# Patient Record
Sex: Female | Born: 1950 | Race: Black or African American | Hispanic: No | Smoking: Current some day smoker
Health system: Southern US, Community
[De-identification: ages and names within clinical notes are randomized; demographics above are authoritative.]

## PROBLEM LIST (undated history)

## (undated) DIAGNOSIS — D649 Anemia, unspecified: Secondary | ICD-10-CM

## (undated) DIAGNOSIS — E119 Type 2 diabetes mellitus without complications: Secondary | ICD-10-CM

## (undated) DIAGNOSIS — E7800 Pure hypercholesterolemia, unspecified: Secondary | ICD-10-CM

## (undated) DIAGNOSIS — I10 Essential (primary) hypertension: Secondary | ICD-10-CM

## (undated) HISTORY — PX: BLADDER SURGERY: SHX569

## (undated) HISTORY — DX: Type 2 diabetes mellitus without complications: E11.9

## (undated) HISTORY — PX: BACK SURGERY: SHX140

## (undated) HISTORY — DX: Anemia, unspecified: D64.9

## (undated) HISTORY — DX: Pure hypercholesterolemia, unspecified: E78.00

## (undated) HISTORY — PX: NECK SURGERY: SHX720

## (undated) HISTORY — DX: Essential (primary) hypertension: I10

---

## 2001-12-31 ENCOUNTER — Encounter: Payer: Self-pay | Admitting: Neurosurgery

## 2001-12-31 ENCOUNTER — Encounter: Admission: RE | Admit: 2001-12-31 | Discharge: 2001-12-31 | Payer: Self-pay | Admitting: Neurosurgery

## 2002-01-22 ENCOUNTER — Encounter: Payer: Self-pay | Admitting: Neurosurgery

## 2002-01-25 ENCOUNTER — Encounter: Payer: Self-pay | Admitting: Neurosurgery

## 2002-01-25 ENCOUNTER — Ambulatory Visit (HOSPITAL_COMMUNITY): Admission: RE | Admit: 2002-01-25 | Discharge: 2002-01-26 | Payer: Self-pay | Admitting: Neurosurgery

## 2004-12-08 ENCOUNTER — Ambulatory Visit: Payer: Self-pay

## 2006-01-03 ENCOUNTER — Ambulatory Visit: Payer: Self-pay

## 2006-09-29 ENCOUNTER — Other Ambulatory Visit: Payer: Self-pay

## 2006-09-29 ENCOUNTER — Observation Stay: Payer: Self-pay | Admitting: Internal Medicine

## 2006-12-12 HISTORY — PX: VAGINAL HYSTERECTOMY: SHX2639

## 2007-01-08 ENCOUNTER — Ambulatory Visit: Payer: Self-pay

## 2007-02-06 ENCOUNTER — Ambulatory Visit: Payer: Self-pay | Admitting: Obstetrics & Gynecology

## 2007-02-13 ENCOUNTER — Inpatient Hospital Stay: Payer: Self-pay | Admitting: Obstetrics & Gynecology

## 2007-10-12 ENCOUNTER — Ambulatory Visit: Payer: Self-pay | Admitting: Internal Medicine

## 2008-01-16 ENCOUNTER — Ambulatory Visit: Payer: Self-pay | Admitting: Gastroenterology

## 2008-01-31 ENCOUNTER — Ambulatory Visit: Payer: Self-pay

## 2009-02-03 ENCOUNTER — Ambulatory Visit: Payer: Self-pay

## 2010-02-04 ENCOUNTER — Ambulatory Visit: Payer: Self-pay

## 2011-02-24 ENCOUNTER — Ambulatory Visit: Payer: Self-pay

## 2011-06-20 ENCOUNTER — Emergency Department: Payer: Self-pay | Admitting: Emergency Medicine

## 2011-06-22 ENCOUNTER — Emergency Department: Payer: Self-pay | Admitting: Emergency Medicine

## 2012-04-05 ENCOUNTER — Ambulatory Visit: Payer: Self-pay

## 2013-04-09 ENCOUNTER — Ambulatory Visit: Payer: Self-pay

## 2013-11-18 ENCOUNTER — Encounter: Payer: Self-pay | Admitting: Podiatry

## 2013-11-18 ENCOUNTER — Ambulatory Visit (INDEPENDENT_AMBULATORY_CARE_PROVIDER_SITE_OTHER): Payer: BC Managed Care – PPO

## 2013-11-18 ENCOUNTER — Ambulatory Visit (INDEPENDENT_AMBULATORY_CARE_PROVIDER_SITE_OTHER): Payer: BC Managed Care – PPO | Admitting: Podiatry

## 2013-11-18 VITALS — BP 145/86 | HR 77 | Resp 16 | Ht 64.0 in | Wt 165.0 lb

## 2013-11-18 DIAGNOSIS — M79609 Pain in unspecified limb: Secondary | ICD-10-CM

## 2013-11-18 DIAGNOSIS — M898X7 Other specified disorders of bone, ankle and foot: Secondary | ICD-10-CM

## 2013-11-18 DIAGNOSIS — M79671 Pain in right foot: Secondary | ICD-10-CM

## 2013-11-18 DIAGNOSIS — M898X9 Other specified disorders of bone, unspecified site: Secondary | ICD-10-CM

## 2013-11-18 NOTE — Progress Notes (Signed)
Chelsea Williams presents today as a 63 year old white female a chief complaint of pain to the hallux nail plate right foot she states is been this way for about 6 weeks slowly getting worse she says it sometimes better than it has been but he the bedsheets bother she's been soaking in Epsom salts and she tried trimming a portion of the nail plate off which resulted in a fracturing of the nail plate she states it has felt some better since that point.  Objective: Vital signs are stable she is alert and oriented x3. I have reviewed her past medical history medications allergies surgeries social history family history and review of systems. Objective evaluation also demonstrates strong palpable pulses bilateral capillary fill time to digits one through 5 is noted to be immediate. Normal neurologic sensorium per Semmes-Weinstein monofilament bilateral. Deep tendon reflexes are equal bilateral. Muscle strength is normal bilateral. Orthopedic evaluation demonstrates all joints distal to the ankle have a full range of motion without crepitus. She does have tenderness on dorsal plantar palpation of the hallux nail plate right greater than left. Radiographic evaluation does demonstrate a subungual exostosis hallux right.  Assessment: Subungual exostosis hallux right.  Plan: Discussed appropriate shoe gear stretching sizes ice therapy debridement options surgical options. I will followup with her as needed.

## 2013-11-18 NOTE — Progress Notes (Signed)
N  PAIN L GREAT TOENAIL RIGHT FOOT D 6 WEEKS O SLOWLY C BETTER  A BED SHEETS T SOAKS IN EPSON SALT

## 2014-05-16 ENCOUNTER — Ambulatory Visit: Payer: Self-pay

## 2015-10-12 ENCOUNTER — Other Ambulatory Visit: Payer: Self-pay | Admitting: Unknown Physician Specialty

## 2015-10-12 ENCOUNTER — Other Ambulatory Visit: Payer: Self-pay | Admitting: *Deleted

## 2015-10-12 ENCOUNTER — Inpatient Hospital Stay
Admission: RE | Admit: 2015-10-12 | Discharge: 2015-10-12 | Disposition: A | Discharge status: Home / Self Care | Payer: Self-pay | Source: Ambulatory Visit | Attending: *Deleted | Admitting: *Deleted

## 2015-10-12 DIAGNOSIS — R928 Other abnormal and inconclusive findings on diagnostic imaging of breast: Secondary | ICD-10-CM

## 2015-10-12 DIAGNOSIS — Z9289 Personal history of other medical treatment: Secondary | ICD-10-CM

## 2015-11-04 ENCOUNTER — Other Ambulatory Visit: Payer: Self-pay | Admitting: Unknown Physician Specialty

## 2015-11-04 ENCOUNTER — Ambulatory Visit
Admission: RE | Admit: 2015-11-04 | Discharge: 2015-11-04 | Disposition: A | Discharge status: Home / Self Care | Payer: BC Managed Care – PPO | Source: Ambulatory Visit | Attending: Unknown Physician Specialty | Admitting: Unknown Physician Specialty

## 2015-11-04 DIAGNOSIS — R928 Other abnormal and inconclusive findings on diagnostic imaging of breast: Secondary | ICD-10-CM | POA: Insufficient documentation

## 2016-10-27 ENCOUNTER — Other Ambulatory Visit: Payer: Self-pay | Admitting: Obstetrics & Gynecology

## 2016-10-27 DIAGNOSIS — Z1231 Encounter for screening mammogram for malignant neoplasm of breast: Secondary | ICD-10-CM

## 2016-10-28 LAB — HM PAP SMEAR: HM PAP: NEGATIVE

## 2016-11-29 IMAGING — MG MM DIAG BREAST TOMO UNI RIGHT
3 series · 4 of 7 positions shown · non-contrast
Comparison: Previous exam(s).

CLINICAL DATA: Patient presents for additional views of the right
breast as followup to a recent screening exam suggesting a possible
mass over the right periareolar region.

EXAM:
DIGITAL DIAGNOSTIC right MAMMOGRAM WITH CAD
ULTRASOUND right BREAST

[R MLO synth-2D]
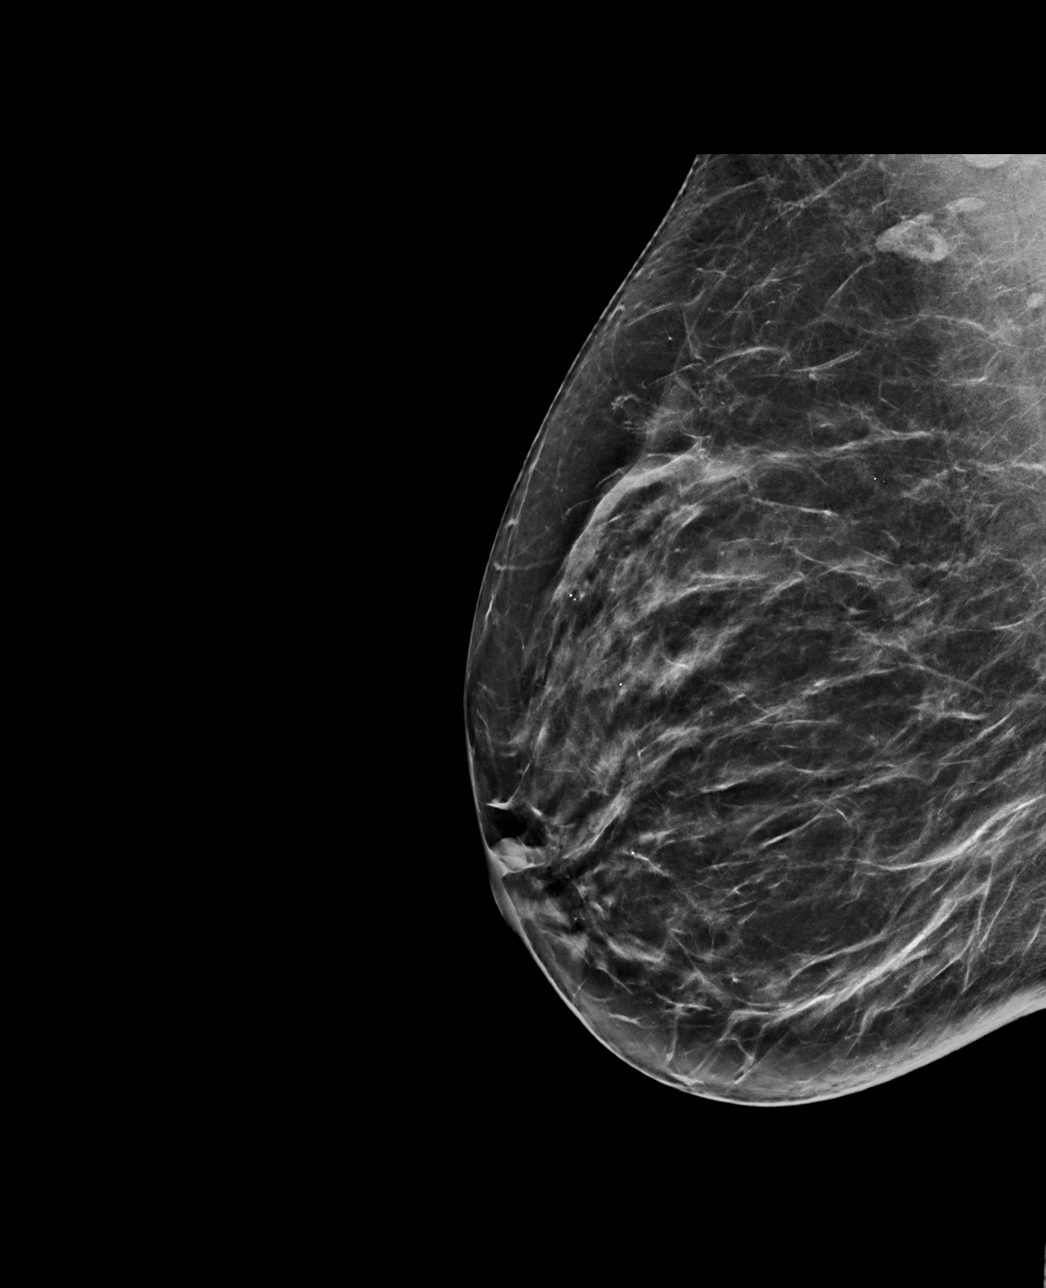

[R MLO]
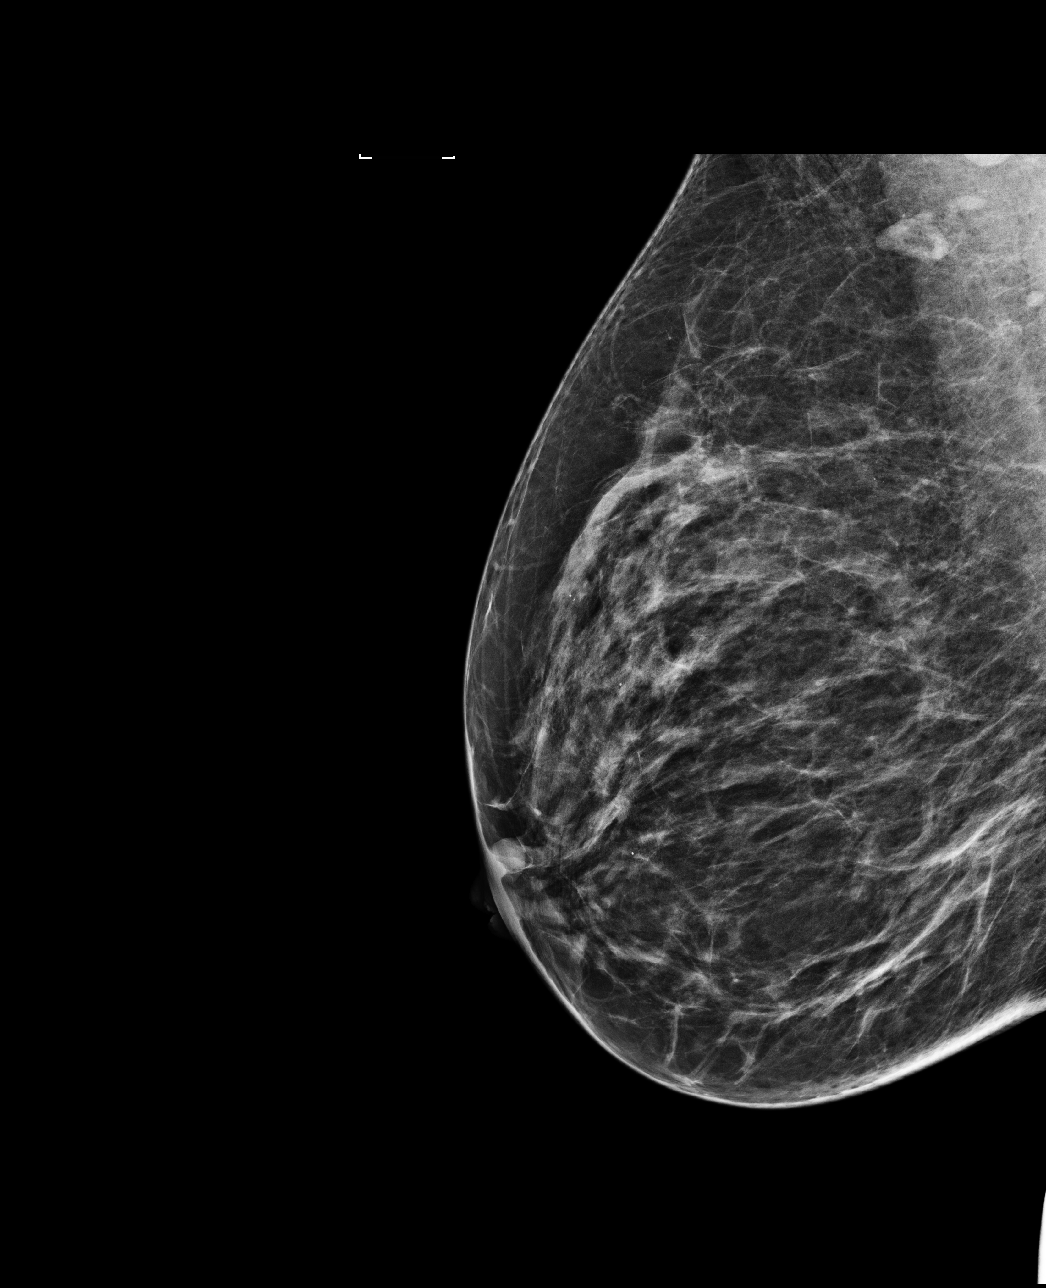

[R CC tomo · 2 of 71 frames shown]
[frame 23/71]
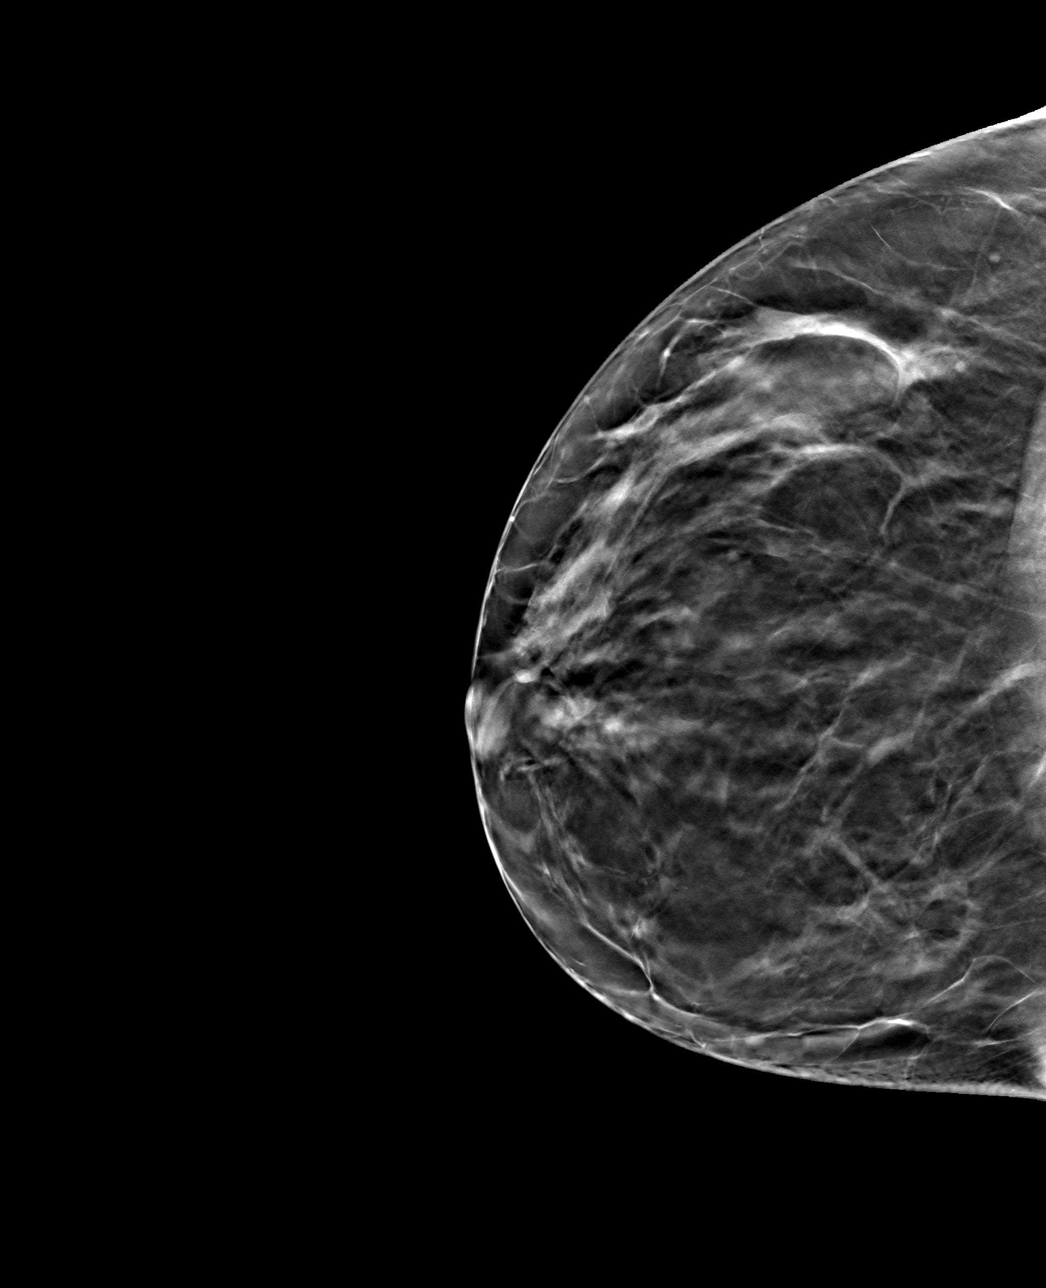
[frame 36/71]
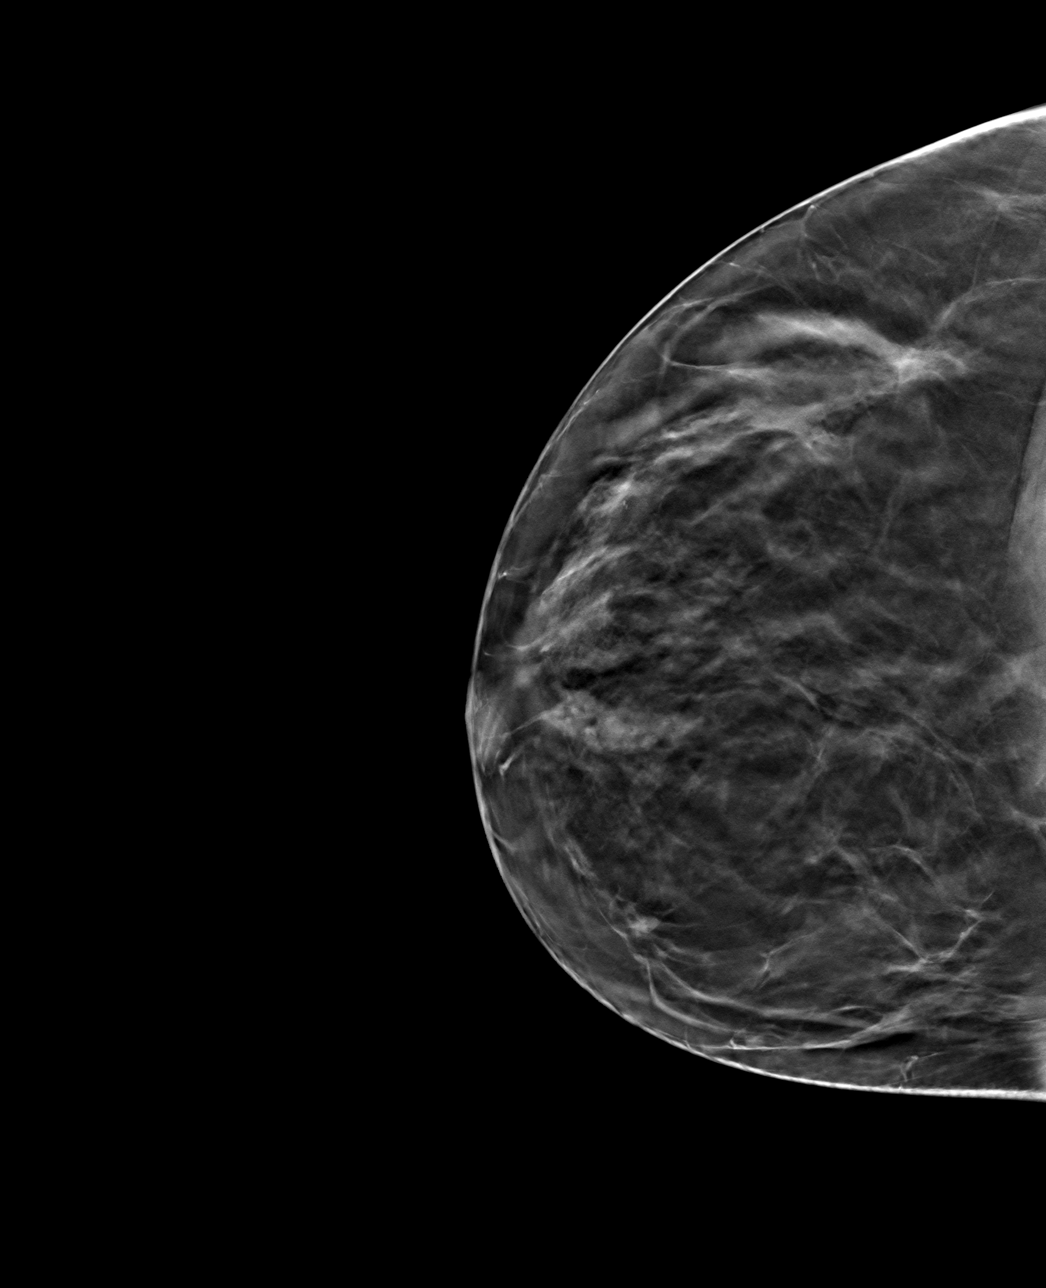

[4 of 7 positions shown; findings below may reference images not displayed]

ACR Breast Density Category b: There are scattered areas of
fibroglandular density.
FINDINGS: CC and MLO tomographic images demonstrate no definite focal
abnormality over the upper central right periareolar region.

Mammographic images were processed with CAD.

Targeted ultrasound is performed, showing no focal abnormality over
the right periareolar/retroareolar region.
IMPRESSION: No focal abnormality over the right periareolar/retroareolar region.

RECOMMENDATION:
Recommend continued annual bilateral screening mammographic
followup.

I have discussed the findings and recommendations with the patient.
Results were also provided in writing at the conclusion of the
visit. If applicable, a reminder letter will be sent to the patient
regarding the next appointment.

BI-RADS CATEGORY  Surg category 1

## 2016-12-08 ENCOUNTER — Ambulatory Visit
Admission: RE | Admit: 2016-12-08 | Discharge: 2016-12-08 | Disposition: A | Discharge status: Home / Self Care | Payer: BC Managed Care – PPO | Source: Ambulatory Visit | Attending: Obstetrics & Gynecology | Admitting: Obstetrics & Gynecology

## 2016-12-08 DIAGNOSIS — Z1231 Encounter for screening mammogram for malignant neoplasm of breast: Secondary | ICD-10-CM | POA: Diagnosis not present

## 2016-12-08 LAB — HM MAMMOGRAPHY

## 2017-10-26 ENCOUNTER — Ambulatory Visit: Payer: BC Managed Care – PPO | Admitting: Obstetrics & Gynecology

## 2017-10-27 ENCOUNTER — Encounter: Payer: Self-pay | Admitting: Obstetrics & Gynecology

## 2017-10-27 ENCOUNTER — Ambulatory Visit (INDEPENDENT_AMBULATORY_CARE_PROVIDER_SITE_OTHER): Payer: BC Managed Care – PPO | Admitting: Obstetrics & Gynecology

## 2017-10-27 VITALS — BP 120/80 | HR 72 | Ht 64.0 in | Wt 164.0 lb

## 2017-10-27 DIAGNOSIS — Z1239 Encounter for other screening for malignant neoplasm of breast: Secondary | ICD-10-CM

## 2017-10-27 DIAGNOSIS — Z1211 Encounter for screening for malignant neoplasm of colon: Secondary | ICD-10-CM

## 2017-10-27 DIAGNOSIS — Z01419 Encounter for gynecological examination (general) (routine) without abnormal findings: Secondary | ICD-10-CM

## 2017-10-27 DIAGNOSIS — Z1231 Encounter for screening mammogram for malignant neoplasm of breast: Secondary | ICD-10-CM

## 2017-10-27 DIAGNOSIS — Z0000 Encounter for general adult medical examination without abnormal findings: Secondary | ICD-10-CM | POA: Insufficient documentation

## 2017-10-27 NOTE — Patient Instructions (Signed)
PAP every three years Mammogram every year    Call [phone] to schedule at [facility] Colonoscopy every 10 years Labs yearly (with PCP) 

## 2017-10-27 NOTE — Progress Notes (Signed)
HPI:      [name] is a 66 y.o. [id number] who LMP was in the past, she presents today for her annual examination.  The patient has no complaints today. The patient is not sexually active. Herlast pap: approximate date 2017 and was normal and last mammogram: approximate date 2017 and was normal.  The patient does perform self breast exams.  There is no notable family history of breast or ovarian cancer in her family. The patient is not taking hormone replacement therapy. Patient denies post-menopausal vaginal bleeding.   The patient has regular exercise: yes. The patient denies current symptoms of depression.    GYN Hx: Last Colonoscopy:8 years ago. Normal.  Last DEXA: never ago.    PMHx: Past Medical History:  Diagnosis Date  . Anemia   . Diabetes (HCC)   . HBP (high blood pressure)   . High cholesterol    Past Surgical History:  Procedure Laterality Date  . BACK SURGERY    . BLADDER SURGERY    . NECK SURGERY     Family History  Problem Relation Age of Onset  . Diabetes Mother   . Diabetes Sister   . Heart failure Paternal Aunt   . Diabetes Maternal Grandmother   . Heart failure Maternal Grandmother   . Breast cancer Neg Hx    Social History   Tobacco Use  . Smoking status: Current Some Day Smoker  . Smokeless tobacco: Never Used  Substance Use Topics  . Alcohol use: Yes    Comment: OCCASIONALLY  . Drug use: No    Current Outpatient Medications:  .  aspirin 325 MG tablet, Take 325 mg by mouth daily., Disp: , Rfl:  .  BIOTIN PO, Take by mouth as needed., Disp: , Rfl:  .  Cholecalciferol (VITAMIN D PO), Take by mouth as needed., Disp: , Rfl:  .  Cyanocobalamin (VITAMIN B 12 PO), Take by mouth as needed., Disp: , Rfl:  .  ezetimibe (ZETIA) 10 MG tablet, Take 10 mg by mouth., Disp: , Rfl:  .  ibuprofen (ADVIL,MOTRIN) 200 MG tablet, Take 200 mg by mouth daily., Disp: , Rfl:  .  metFORMIN (GLUCOPHAGE) 1000 MG tablet, , Disp: , Rfl:  .  Multiple Vitamins-Minerals  (MULTIVITAMIN PO), Take by mouth daily., Disp: , Rfl:  .  ONE TOUCH ULTRA TEST test strip, , Disp: , Rfl:  .  Red Yeast Rice 600 MG TABS, Take by mouth., Disp: , Rfl:  .  SUPREP BOWEL PREP SOLN, , Disp: , Rfl:  .  FOLIC ACID PO, Take by mouth as needed., Disp: , Rfl:  .  valsartan-hydrochlorothiazide (DIOVAN-HCT) 160-12.5 MG per tablet, , Disp: , Rfl:  .  VITAMIN E PO, Take by mouth as needed., Disp: , Rfl:  .  ZETIA 10 MG tablet, , Disp: , Rfl:  Allergies: Lactuca virosa  Review of Systems  Constitutional: Negative for chills, fever and malaise/fatigue.  HENT: Negative for congestion, sinus pain and sore throat.   Eyes: Negative for blurred vision and pain.  Respiratory: Negative for cough and wheezing.   Cardiovascular: Negative for chest pain and leg swelling.  Gastrointestinal: Negative for abdominal pain, constipation, diarrhea, heartburn, nausea and vomiting.  Genitourinary: Negative for dysuria, frequency, hematuria and urgency.  Musculoskeletal: Negative for back pain, joint pain, myalgias and neck pain.  Skin: Negative for itching and rash.  Neurological: Negative for dizziness, tremors and weakness.  Endo/Heme/Allergies: Does not bruise/bleed easily.  Psychiatric/Behavioral: Negative for depression. The patient  is not nervous/anxious and does not have insomnia.     Objective: BP 120/80   Pulse 72   Ht 5\' 4"  (1.626 m)   Wt 164 lb (74.4 kg)   BMI 28.15 kg/m   Filed Weights   10/27/17 0931  Weight: 164 lb (74.4 kg)   Body mass index is 28.15 kg/m. Physical Exam  Constitutional: She is oriented to person, place, and time. She appears well-developed and well-nourished. No distress.  Genitourinary: Rectum normal, vagina normal and uterus normal. Pelvic exam was performed with patient supine. There is no rash or lesion on the right labia. There is no rash or lesion on the left labia. Vagina exhibits no lesion. No bleeding in the vagina. Right adnexum does not display mass  and does not display tenderness. Left adnexum does not display mass and does not display tenderness. Cervix does not exhibit motion tenderness, lesion, friability or polyp.   Uterus is mobile and midaxial. Uterus is not enlarged or exhibiting a mass.  HENT:  Head: Normocephalic and atraumatic. Head is without laceration.  Right Ear: Hearing normal.  Left Ear: Hearing normal.  Nose: No epistaxis.  No foreign bodies.  Mouth/Throat: Uvula is midline, oropharynx is clear and moist and mucous membranes are normal.  Eyes: Pupils are equal, round, and reactive to light.  Neck: Normal range of motion. Neck supple. No thyromegaly present.  Cardiovascular: Normal rate and regular rhythm. Exam reveals no gallop and no friction rub.  No murmur heard. Pulmonary/Chest: Effort normal and breath sounds normal. No respiratory distress. She has no wheezes. Right breast exhibits no mass, no skin change and no tenderness. Left breast exhibits no mass, no skin change and no tenderness.  Abdominal: Soft. Bowel sounds are normal. She exhibits no distension. There is no tenderness. There is no rebound.  Musculoskeletal: Normal range of motion.  Neurological: She is alert and oriented to person, place, and time. No cranial nerve deficit.  Skin: Skin is warm and dry.  Psychiatric: She has a normal mood and affect. Judgment normal.  Vitals reviewed.  Assessment: Annual Exam 1. Annual physical exam   2. Screening for breast cancer    Plan:            1.  Cervical Screening-  Pap smear schedule reviewed with patient  2. Breast screening- Exam annually and mammogram scheduled  3. Colonoscopy every 10 years, Hemoccult testing after age 64  4. Labs managed by PCP  5. Counseling for hormonal therapy: none, no change in therapy today    F/U  Return in about 1 year (around 10/27/2018) for Annual.  Annamarie MajorPaul Hartman Minahan, MD, Merlinda FrederickFACOG [facility], [facility] 10/27/2017  10:12 AM

## 2017-11-04 LAB — FECAL OCCULT BLOOD, IMMUNOCHEMICAL: Fecal Occult Bld: NEGATIVE

## 2017-12-13 ENCOUNTER — Ambulatory Visit
Admission: RE | Admit: 2017-12-13 | Discharge: 2017-12-13 | Disposition: A | Discharge status: Home / Self Care | Payer: BC Managed Care – PPO | Source: Ambulatory Visit | Attending: Obstetrics & Gynecology | Admitting: Obstetrics & Gynecology

## 2017-12-13 DIAGNOSIS — Z1231 Encounter for screening mammogram for malignant neoplasm of breast: Secondary | ICD-10-CM | POA: Diagnosis not present

## 2017-12-13 DIAGNOSIS — Z1239 Encounter for other screening for malignant neoplasm of breast: Secondary | ICD-10-CM

## 2018-01-03 IMAGING — MG MM DIGITAL SCREENING BILAT W/ CAD
4 series · 4 of 4 positions shown · non-contrast
Comparison: Previous exam(s).

CLINICAL DATA: Screening.

EXAM:
DIGITAL SCREENING BILATERAL MAMMOGRAM WITH CAD

[R CC]
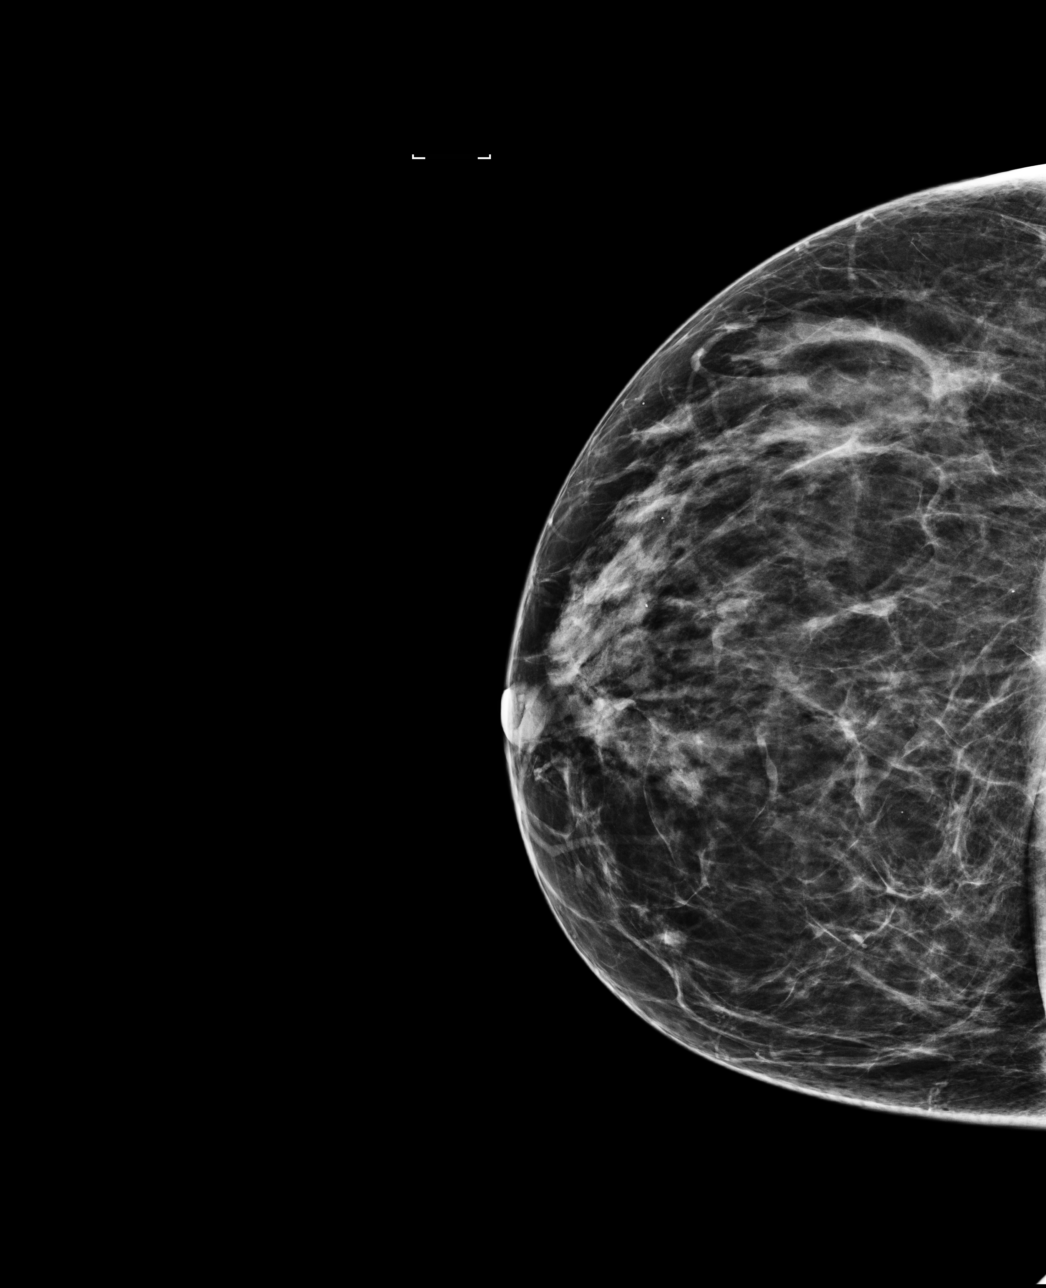

[R MLO]
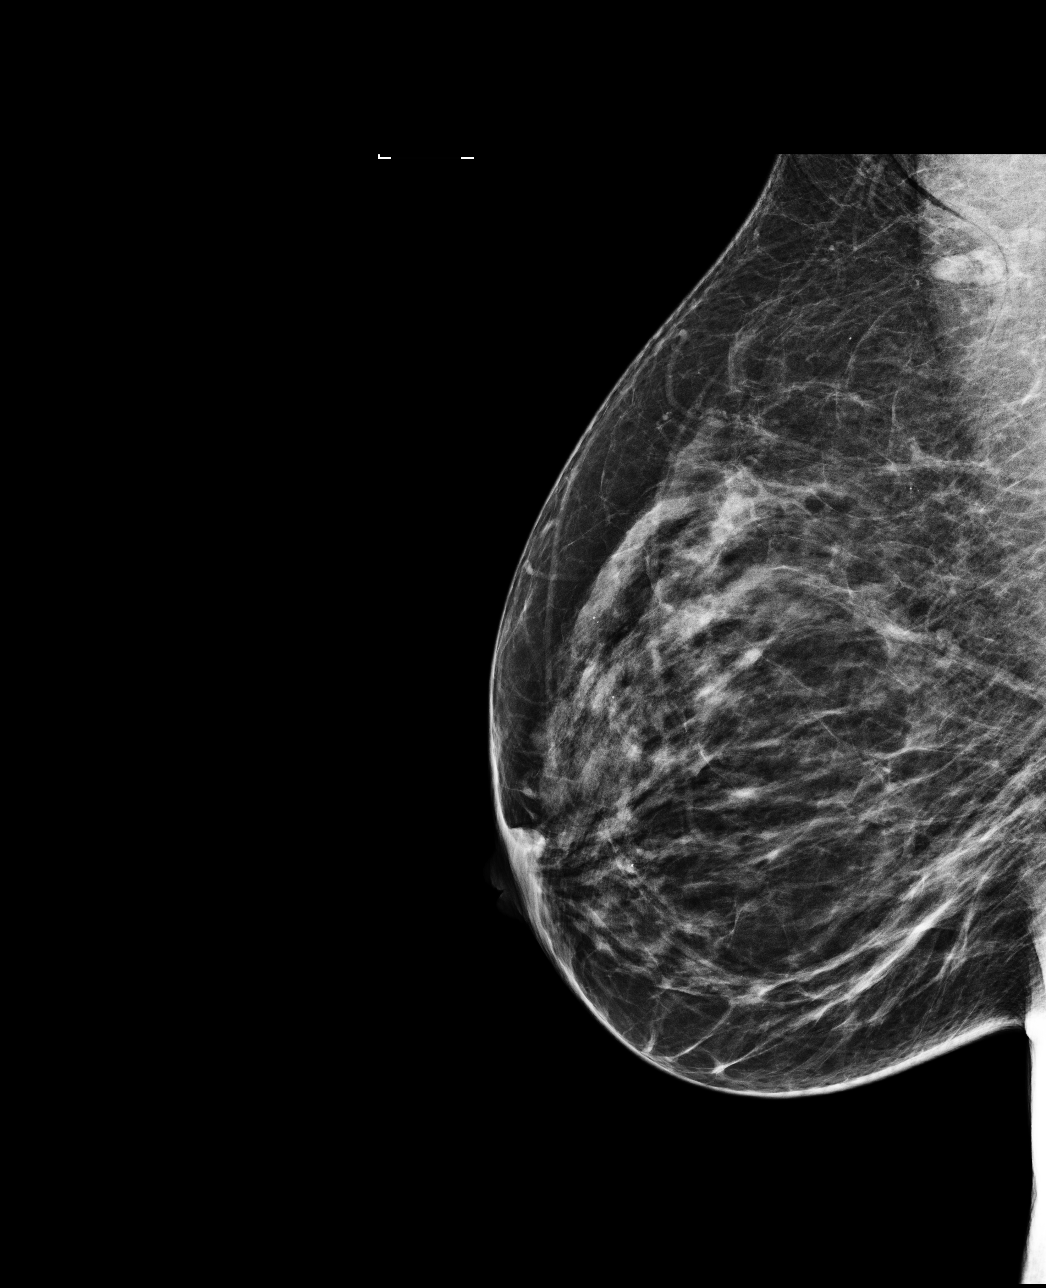

[L MLO]
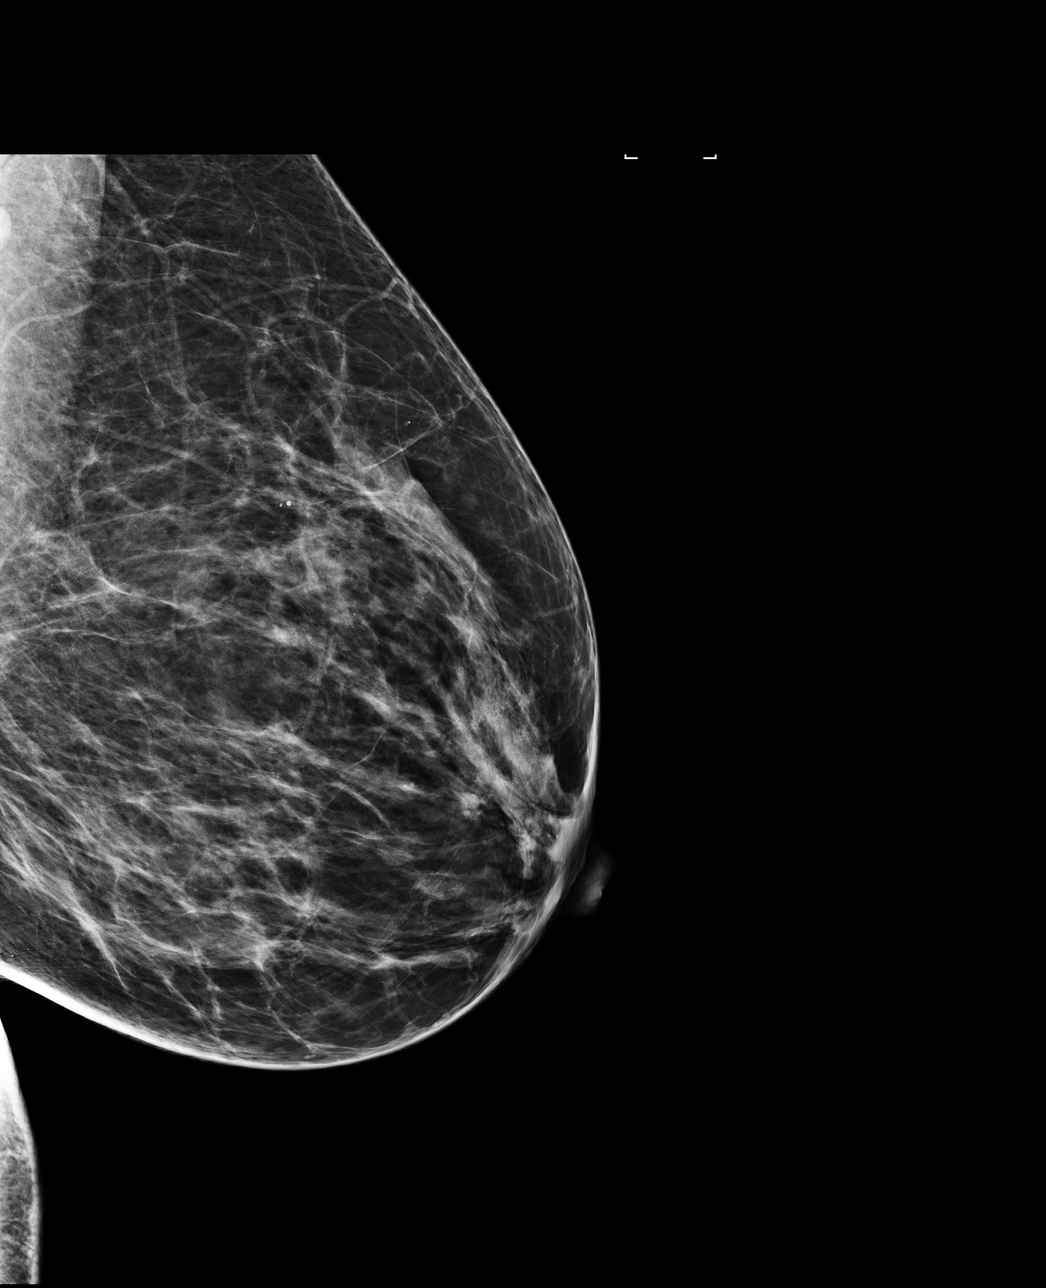

[L CC]
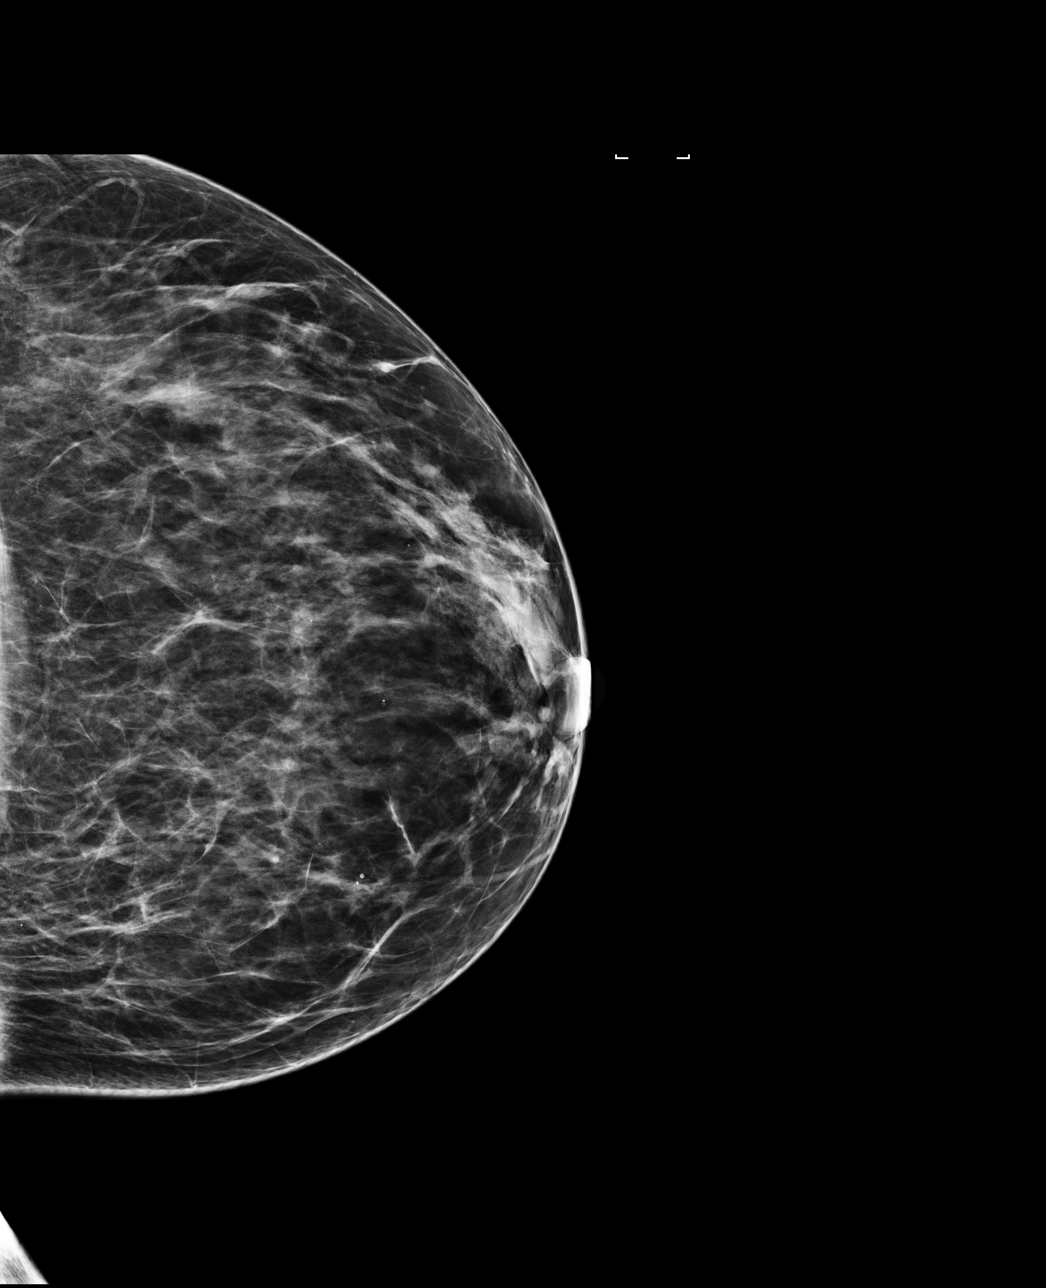

[4 of 4 positions shown; findings below may reference images not displayed]

ACR Breast Density Category b: There are scattered areas of
fibroglandular density.
FINDINGS: There are no findings suspicious for malignancy. Images were
processed with CAD.
IMPRESSION: No mammographic evidence of malignancy. A result letter of this
screening mammogram will be mailed directly to the patient.

RECOMMENDATION:
Screening mammogram in one year. (Code:AS-G-LCT)

BI-RADS CATEGORY  1: Negative.

## 2018-11-21 ENCOUNTER — Other Ambulatory Visit: Payer: Self-pay | Admitting: Internal Medicine

## 2018-11-21 DIAGNOSIS — Z1231 Encounter for screening mammogram for malignant neoplasm of breast: Secondary | ICD-10-CM

## 2018-12-20 ENCOUNTER — Ambulatory Visit
Admission: RE | Admit: 2018-12-20 | Discharge: 2018-12-20 | Disposition: A | Discharge status: Home / Self Care | Payer: Medicare Other | Source: Ambulatory Visit | Attending: Internal Medicine | Admitting: Internal Medicine

## 2018-12-20 DIAGNOSIS — Z1231 Encounter for screening mammogram for malignant neoplasm of breast: Secondary | ICD-10-CM | POA: Insufficient documentation

## 2019-01-01 ENCOUNTER — Other Ambulatory Visit (HOSPITAL_COMMUNITY)
Admission: RE | Admit: 2019-01-01 | Discharge: 2019-01-01 | Disposition: A | Discharge status: Home / Self Care | Payer: Medicare Other | Source: Ambulatory Visit | Attending: Obstetrics & Gynecology | Admitting: Obstetrics & Gynecology

## 2019-01-01 ENCOUNTER — Ambulatory Visit (INDEPENDENT_AMBULATORY_CARE_PROVIDER_SITE_OTHER): Payer: Medicare Other | Admitting: Obstetrics & Gynecology

## 2019-01-01 ENCOUNTER — Encounter: Payer: Self-pay | Admitting: Obstetrics & Gynecology

## 2019-01-01 VITALS — BP 110/70 | Ht 64.0 in | Wt 149.0 lb

## 2019-01-01 DIAGNOSIS — N952 Postmenopausal atrophic vaginitis: Secondary | ICD-10-CM | POA: Insufficient documentation

## 2019-01-01 DIAGNOSIS — Z1272 Encounter for screening for malignant neoplasm of vagina: Secondary | ICD-10-CM

## 2019-01-01 DIAGNOSIS — Z01419 Encounter for gynecological examination (general) (routine) without abnormal findings: Secondary | ICD-10-CM

## 2019-01-01 NOTE — Patient Instructions (Signed)
PAP every three years Mammogram every year    Call [phone] to schedule at [facility] Colonoscopy every 10 years (soon) Labs yearly (with PCP)

## 2019-01-01 NOTE — Progress Notes (Signed)
HPI:      [name] is a 68 y.o. [id number] who LMP was in the past, she presents today for her annual examination.  The patient has no complaints today. The patient is not currently sexually active. Herlast pap: approximate date 2017 and was normal and last mammogram: approximate date 12/2018 and was normal.  The patient does perform self breast exams.  There is no notable family history of breast or ovarian cancer in her family. The patient is not taking hormone replacement therapy. Patient denies post-menopausal vaginal bleeding.   The patient has regular exercise: yes. The patient denies current symptoms of depression.    Prior TVH.  Prior Sling for GSI.  Doing well. Some vag dryness. Replens helps.  GYN Hx: Last Colonoscopy:10 years ago. Normal.  Last DEXA: never ago.    PMHx: Past Medical History:  Diagnosis Date  . Anemia   . Diabetes (HCC)   . HBP (high blood pressure)   . High cholesterol    Past Surgical History:  Procedure Laterality Date  . BACK SURGERY    . BLADDER SURGERY    . NECK SURGERY    . VAGINAL HYSTERECTOMY  2008   Family History  Problem Relation Age of Onset  . Diabetes Mother   . Diabetes Sister   . Cancer Sister   . Heart failure Paternal Aunt   . Diabetes Maternal Grandmother   . Heart failure Maternal Grandmother   . Cancer Maternal Aunt   . Breast cancer Neg Hx    Social History   Tobacco Use  . Smoking status: Current Some Day Smoker  . Smokeless tobacco: Never Used  Substance Use Topics  . Alcohol use: Yes    Comment: OCCASIONALLY  . Drug use: No    Current Outpatient Medications:  .  aspirin 325 MG tablet, Take 325 mg by mouth daily., Disp: , Rfl:  .  Cholecalciferol (VITAMIN D PO), Take by mouth as needed., Disp: , Rfl:  .  ezetimibe (ZETIA) 10 MG tablet, Take 10 mg by mouth., Disp: , Rfl:  .  metFORMIN (GLUCOPHAGE) 1000 MG tablet, , Disp: , Rfl:  .  Multiple Vitamins-Minerals (MULTIVITAMIN PO), Take by mouth daily., Disp: ,  Rfl:  .  ONE TOUCH ULTRA TEST test strip, , Disp: , Rfl:  .  Red Yeast Rice 600 MG TABS, Take by mouth., Disp: , Rfl:  .  valsartan-hydrochlorothiazide (DIOVAN-HCT) 160-12.5 MG per tablet, , Disp: , Rfl:  .  VITAMIN E PO, Take by mouth as needed., Disp: , Rfl:  .  BIOTIN PO, Take by mouth as needed., Disp: , Rfl:  .  Cyanocobalamin (VITAMIN B 12 PO), Take by mouth as needed., Disp: , Rfl:  .  FOLIC ACID PO, Take by mouth as needed., Disp: , Rfl:  .  ibuprofen (ADVIL,MOTRIN) 200 MG tablet, Take 200 mg by mouth daily., Disp: , Rfl:  .  metFORMIN (GLUCOPHAGE) 1000 MG tablet, Take by mouth., Disp: , Rfl:  .  SUPREP BOWEL PREP SOLN, , Disp: , Rfl:  .  ZETIA 10 MG tablet, , Disp: , Rfl:  Allergies: Lactuca virosa  Review of Systems  Constitutional: Negative for chills, fever and malaise/fatigue.  HENT: Negative for congestion, sinus pain and sore throat.   Eyes: Negative for blurred vision and pain.  Respiratory: Negative for cough and wheezing.   Cardiovascular: Negative for chest pain and leg swelling.  Gastrointestinal: Negative for abdominal pain, constipation, diarrhea, heartburn, nausea and vomiting.  Genitourinary: Negative for dysuria, frequency, hematuria and urgency.  Musculoskeletal: Negative for back pain, joint pain, myalgias and neck pain.  Skin: Negative for itching and rash.  Neurological: Negative for dizziness, tremors and weakness.  Endo/Heme/Allergies: Does not bruise/bleed easily.  Psychiatric/Behavioral: Negative for depression. The patient is not nervous/anxious and does not have insomnia.     Objective: BP 110/70   Ht 5\' 4"  (1.626 m)   Wt 149 lb (67.6 kg)   BMI 25.58 kg/m   Filed Weights   01/01/19 1426  Weight: 149 lb (67.6 kg)   Body mass index is 25.58 kg/m. Physical Exam Constitutional:      General: She is not in acute distress.    Appearance: She is well-developed.  Genitourinary:     Pelvic exam was performed with patient supine.     Vagina and  rectum normal.     No lesions in the vagina.     No vaginal bleeding.     No right or left adnexal mass present.     Right adnexa not tender.     Left adnexa not tender.     Genitourinary Comments: Absent Uterus Absent cervix Vaginal cuff well healed  HENT:     Head: Normocephalic and atraumatic. No laceration.     Right Ear: Hearing normal.     Left Ear: Hearing normal.     Mouth/Throat:     Pharynx: Uvula midline.  Eyes:     Pupils: Pupils are equal, round, and reactive to light.  Neck:     Musculoskeletal: Normal range of motion and neck supple.     Thyroid: No thyromegaly.  Cardiovascular:     Rate and Rhythm: Normal rate and regular rhythm.     Heart sounds: No murmur. No friction rub. No gallop.   Pulmonary:     Effort: Pulmonary effort is normal. No respiratory distress.     Breath sounds: Normal breath sounds. No wheezing.  Chest:     Breasts:        Right: No mass, skin change or tenderness.        Left: No mass, skin change or tenderness.  Abdominal:     General: Bowel sounds are normal. There is no distension.     Palpations: Abdomen is soft.     Tenderness: There is no abdominal tenderness. There is no rebound.  Musculoskeletal: Normal range of motion.  Neurological:     Mental Status: She is alert and oriented to person, place, and time.     Cranial Nerves: No cranial nerve deficit.  Skin:    General: Skin is warm and dry.  Psychiatric:        Judgment: Judgment normal.  Vitals signs reviewed.     Assessment: Annual Exam 1. Women's annual routine gynecological examination   2. Screening for vaginal cancer   3. Vaginitis, atrophic     Plan:            1.  Cervical Screening-  Pap smear done today  2. Breast screening- Exam annually and mammogram scheduled  3. Colonoscopy every 10 years and is scheduled for 01/13/2019, Hemoccult testing after age 67  4. Labs managed by PCP  5. Counseling for hormonal therapy: none  6. Monitor bladder sx's, no  s/sx incontinence or prolapse at this time  7. Vaginal atrophy, cont Replens 2x/week     F/U  Return in about 1 year (around 01/02/2020) for Annual.  Annamarie Major, MD, [facility], [facility] 01/01/2019  3:03 PM

## 2019-01-04 LAB — CYTOLOGY - PAP: Diagnosis: NEGATIVE
